# Patient Record
Sex: Female | Born: 1995 | Race: Black or African American | Hispanic: No | Marital: Single | Smoking: Never smoker
Health system: Southern US, Community
[De-identification: ages and names within clinical notes are randomized; demographics above are authoritative.]

## PROBLEM LIST (undated history)

## (undated) DIAGNOSIS — N809 Endometriosis, unspecified: Secondary | ICD-10-CM

## (undated) DIAGNOSIS — F419 Anxiety disorder, unspecified: Secondary | ICD-10-CM

---

## 2018-02-07 ENCOUNTER — Emergency Department (HOSPITAL_COMMUNITY)
Admission: EM | Admit: 2018-02-07 | Discharge: 2018-02-08 | Disposition: A | Discharge status: Home / Self Care | Payer: Self-pay | Attending: Emergency Medicine | Admitting: Emergency Medicine

## 2018-02-07 ENCOUNTER — Encounter (HOSPITAL_COMMUNITY): Payer: Self-pay

## 2018-02-07 DIAGNOSIS — F329 Major depressive disorder, single episode, unspecified: Secondary | ICD-10-CM | POA: Insufficient documentation

## 2018-02-07 DIAGNOSIS — R45851 Suicidal ideations: Secondary | ICD-10-CM

## 2018-02-07 DIAGNOSIS — F32A Depression, unspecified: Secondary | ICD-10-CM

## 2018-02-07 LAB — CBC
HCT: 35.7 % — ABNORMAL LOW (ref 36.0–46.0)
Hemoglobin: 11.8 g/dL — ABNORMAL LOW (ref 12.0–15.0)
MCH: 27 pg (ref 26.0–34.0)
MCHC: 33.1 g/dL (ref 30.0–36.0)
MCV: 81.7 fL (ref 78.0–100.0)
Platelets: 360 10*3/uL (ref 150–400)
RBC: 4.37 MIL/uL (ref 3.87–5.11)
RDW: 12.7 % (ref 11.5–15.5)
WBC: 6.2 10*3/uL (ref 4.0–10.5)

## 2018-02-07 LAB — COMPREHENSIVE METABOLIC PANEL
ALT: 9 U/L — ABNORMAL LOW (ref 14–54)
AST: 15 U/L (ref 15–41)
Albumin: 4.5 g/dL (ref 3.5–5.0)
Alkaline Phosphatase: 55 U/L (ref 38–126)
Anion gap: 13 (ref 5–15)
BUN: 11 mg/dL (ref 6–20)
CO2: 22 mmol/L (ref 22–32)
Calcium: 9.8 mg/dL (ref 8.9–10.3)
Chloride: 106 mmol/L (ref 101–111)
Creatinine, Ser: 0.63 mg/dL (ref 0.44–1.00)
GFR calc Af Amer: >60 mL/min (ref >60)
GFR calc non Af Amer: >60 mL/min (ref >60)
Glucose, Bld: 94 mg/dL (ref 65–99)
Potassium: 3.6 mmol/L (ref 3.5–5.1)
Sodium: 141 mmol/L (ref 135–145)
Total Bilirubin: 0.4 mg/dL (ref 0.3–1.2)
Total Protein: 8.2 g/dL — ABNORMAL HIGH (ref 6.5–8.1)

## 2018-02-07 LAB — RAPID URINE DRUG SCREEN, HOSP PERFORMED
Amphetamines: NOT DETECTED
Barbiturates: NOT DETECTED
Benzodiazepines: NOT DETECTED
Cocaine: NOT DETECTED
Opiates: NOT DETECTED
Tetrahydrocannabinol: NOT DETECTED

## 2018-02-07 LAB — I-STAT BETA HCG BLOOD, ED (MC, WL, AP ONLY): I-stat hCG, quantitative: <5 m[IU]/mL (ref <5)

## 2018-02-07 LAB — ETHANOL: Alcohol, Ethyl (B): <10 mg/dL (ref <10)

## 2018-02-07 MED ORDER — ONDANSETRON HCL 4 MG PO TABS: 4.0000 mg | ORAL_TABLET | Freq: Three times a day (TID) | ORAL | Status: DC | PRN

## 2018-02-07 MED ORDER — ACETAMINOPHEN 325 MG PO TABS: 650.0000 mg | ORAL_TABLET | ORAL | Status: DC | PRN

## 2018-02-07 MED ORDER — ALUM & MAG HYDROXIDE-SIMETH 200-200-20 MG/5ML PO SUSP: 30.0000 mL | Freq: Four times a day (QID) | ORAL | Status: DC | PRN

## 2018-02-07 NOTE — ED Provider Notes (Signed)
[facility]-EMERGENCY DEPT Provider Note   CSN: [id number] Arrival date & time: 02/07/18  2016     History   Chief Complaint Chief Complaint  Patient presents with  . Suicidal    HPI Truitt Merleveri Quizhpi is a 22 y.o. female.  The history is provided by the patient and medical records. No language interpreter was used.    Truitt Merleveri Junco is a 22 y.o. female  with no known PMH who presents to the Emergency Department complaining of suicidal thoughts. Patient states that she has had a lot of family issues recently.  She has just gotten close to her dad again, however her dad does not really understand depression or her mood changes.  She tried to vent to him, but he made her feel bad about it like it was her fault.  She also reports that her aunt has distanced herself from her as well.  Overall, she just feels as if her mood has been low.  She was at an apartment complex today and stood on the ledge, contemplating whether or not she should jump.  She was having suicidal thoughts.  She assumes a bystander or friend saw her as [employer] was called.  She states they told her she should come to the emergency department and she voluntarily agreed.  She believes that she should be here and is interested in seeking help.   History reviewed. No pertinent past medical history.  There are no active problems to display for this patient.   History reviewed. No pertinent surgical history.  OB History    No data available       Home Medications    Prior to Admission medications   Medication Sig Start Date End Date Taking? Authorizing Provider  naproxen sodium (ALEVE) 220 MG tablet Take 220 mg by mouth daily as needed.   Yes [provider]    Family History History reviewed. No pertinent family history.  Social History Social History   Tobacco Use  . Smoking status: Never Smoker  . Smokeless tobacco: Never Used  Substance Use Topics  . Alcohol use: No    Frequency: Never    . Drug use: No     Allergies   Cashew nut oil and Watermelon [citrullus vulgaris]   Review of Systems Review of Systems  Psychiatric/Behavioral: Positive for suicidal ideas.  All other systems reviewed and are negative.    Physical Exam Updated Vital Signs BP 124/82 (BP Location: Left Arm)   Pulse 80   Temp 98.1 F (36.7 C) (Oral)   Resp 18   LMP 01/27/2018   SpO2 100%   Physical Exam  Constitutional: She is oriented to person, place, and time. She appears well-developed and well-nourished. No distress.  HENT:  Head: Normocephalic and atraumatic.  Cardiovascular: Normal rate, regular rhythm and normal heart sounds.  No murmur heard. Pulmonary/Chest: Effort normal and breath sounds normal. No respiratory distress.  Abdominal: Soft. She exhibits no distension. There is no tenderness.  Musculoskeletal: Normal range of motion.  Neurological: She is alert and oriented to person, place, and time.  Skin: Skin is warm and dry.  Nursing note and vitals reviewed.   ED Treatments / Results  Labs (all labs ordered are listed, but only abnormal results are displayed) Labs Reviewed  COMPREHENSIVE METABOLIC PANEL - Abnormal; Notable for the following components:      Result Value   Total Protein 8.2 (*)    ALT 9 (*)    All other components  within normal limits  CBC - Abnormal; Notable for the following components:   Hemoglobin 11.8 (*)    HCT 35.7 (*)    All other components within normal limits  ETHANOL  RAPID URINE DRUG SCREEN, HOSP PERFORMED  I-STAT BETA HCG BLOOD, ED ([facility], [facility], [facility] ONLY)    EKG  EKG Interpretation None       Radiology No results found.  Procedures Procedures (including critical care time)  Medications Ordered in ED Medications - No data to display   Initial Impression / Assessment and Plan / ED Course  I have reviewed the triage vital signs and the nursing notes.  Pertinent labs & imaging results that were available during my care of  the patient were reviewed by me and considered in my medical decision making (see chart for details).    Otila Starn is a 22 y.o. female who presents to ED for suicidal thoughts. Afebrile, hemodynamically stable. Labs reviewed. Mild anemia - PCP follow up encouraged - otherwise reassuring. Medically cleared with dispo per TTS recommendations.   Final Clinical Impressions(s) / ED Diagnoses   Final diagnoses:  Suicidal thoughts    ED Discharge Orders    None       Fabien Travelstead, Chase Picket, PA-C 02/07/18 2314    Shaune Pollack, MD 02/08/18 [phone]

## 2018-02-07 NOTE — ED Triage Notes (Signed)
Pt is voluntary brought in by [employer], girlfriend is at bedside [employer] says that girlfriend called and said that the patient was going to kill herself, when [employer] arrived the patient was found sitting on the couch in the apartment and voluntarily wanted to come to the ED Pt states that she's having trouble at home but wouldn't elaborate too much

## 2018-02-08 ENCOUNTER — Other Ambulatory Visit: Payer: Self-pay

## 2018-02-08 ENCOUNTER — Encounter (HOSPITAL_COMMUNITY): Payer: Self-pay | Admitting: Emergency Medicine

## 2018-02-08 DIAGNOSIS — F32A Depression, unspecified: Secondary | ICD-10-CM

## 2018-02-08 DIAGNOSIS — F329 Major depressive disorder, single episode, unspecified: Secondary | ICD-10-CM

## 2018-02-08 DIAGNOSIS — R45851 Suicidal ideations: Secondary | ICD-10-CM

## 2018-02-08 NOTE — BHH Counselor (Signed)
Attempted to assess the pt.  TTS called the pt's name and she opened her eyes and put the sheet over her head.  Her name was called loudly several more times.  The pt did not respond.  Will attempt to assess the pt at another time.

## 2018-02-08 NOTE — BH Assessment (Signed)
[facility]  Per Laveda AbbeLaurie Britton Parks, FNP, this pt does not require psychiatric hospitalization at this time.  [facility].  This has been included in pt's discharge instructions.  Pt's nurse, Carlisle BeersLuann, has been notified.  Doylene Canninghomas Larwence Tu, MA Triage Specialist [phone]

## 2018-02-08 NOTE — Discharge Instructions (Signed)
For your behavioral health needs you are advised to follow up with [facility].  New patients are seen at their walk-in clinic.  Walk-in hours are Monday - Friday from 8:00 am - 12:00 pm, and from 1:00 pm - 3:00 pm.  Walk-in patients are seen on a first come, first served basis, so try to arrive as early as possible for the best chance of being seen the same day.  There is an initial fee of $22.50: ° °     [facility] °     [address] °     [place], [place] [address] °     [phone] °

## 2018-02-08 NOTE — BH Assessment (Signed)
Assessment Note  Anne Weber is a 22 y.o. female who presented to [facility] on 02/07/18 (transported with police) after she made a suicidal threat to girlfriend.  Pt has not been assessed by TTS previously.  Pt lives in [place] with her girlfriend and a roommate.  She works and will begin a job with [employer] soon.  Pt reported that she has been upset lately because of the pending anniversary of her mother's death.  Pt stated that yesterday she argued with her girlfriend, felt unheard, and in frustration stated, ''I'm going to jump off a bridge."  Pt denied that it was an actual suicide threat.  She denied any past suicide attempts.  Pt endorsed despondency, irritability, and some insomnia (about 6 hours per night).  Pt denied any past psychiatric or therapy care, but she acknowledged that she needed therapy to help her sort through emotions and develop appropriate coping skills.  Pt denied homicidal ideation, auditory/visual hallucination, self-injurious behavior, and substance-use behavior.  During assessment, Pt presented as alert and oriented.  She had good eye contact and was cooperative.  Pt was dressed in scrubs and appeared appropriately groomed.  Pt endorsed some despondency, irritability, and insomnia.  She denied suicidal ideation, homicidal ideation, hallucination, substance use concerns, and self-injurious behavior.  Pt denied any past psychiatric care.  Pt's speech was normal in rate, rhythm, and volume.  Pt's thought processes were within normal range, and thought content was logical and goal-oriented.  There was no evidence of delusion.  Pt's memory and concentration were intact.  Pt's impulse control was fair.  Judgment and insight were fair.  Consulted with Irving Burton, NP who recommended discharge with referral to appropriate outpatient provider.  Diagnosis: F32.2 Major Depressive Disorder, Single EP, Moderate  Past Medical History: History reviewed. No pertinent past medical history.  History  reviewed. No pertinent surgical history.  Family History: History reviewed. No pertinent family history.  Social History:  reports that  has never smoked. she has never used smokeless tobacco. She reports that she does not drink alcohol or use drugs.  Additional Social History:  Alcohol / Drug Use Pain Medications: See MAR Prescriptions: See MAR Over the Counter: See MAR History of alcohol / drug use?: No history of alcohol / drug abuse  CIWA: CIWA-Ar BP: 111/72 Pulse Rate: 82 COWS:    Allergies:  Allergies  Allergen Reactions  . Cashew Nut Oil Other (See Comments)    Tongue swelling  . Watermelon [Citrullus Vulgaris] Hives    Home Medications:  (Not in a hospital admission)  OB/GYN Status:  Patient's last menstrual period was 01/27/2018.  General Assessment Data Assessment unable to be completed: Yes Reason for not completing assessment: pt would not respond [facility] Assessment: [facility] TTS Assessment: In system Is this a Tele or Face-to-Face Assessment?: Face-to-Face Is this an Initial Assessment or a Re-assessment for this encounter?: Initial Assessment Marital status: Long term relationship Is patient pregnant?: No Pregnancy Status: No Living Arrangements: Spouse/significant other, Other (Comment)(Lives with girlfriend, roommate) Can pt return to current living arrangement?: Yes Admission Status: Voluntary Is patient capable of signing voluntary admission?: Yes Referral Source: Self/Family/Friend Insurance type: Self pay     Crisis Care Plan Living Arrangements: Spouse/significant other, Other (Comment)(Lives with girlfriend, roommate) Name of Psychiatrist: None Name of Therapist: None  Education Status Is patient currently in school?: No  Risk to self with the past 6 months Suicidal Ideation: No-Not Currently/Within Last 6 Months Has patient been a risk to self within  the past 6 months prior to admission? : No Suicidal Intent: No-Not Currently/Within  Last 6 Months Has patient had any suicidal intent within the past 6 months prior to admission? : No Is patient at risk for suicide?: No Suicidal Plan?: No-Not Currently/Within Last 6 Months Has patient had any suicidal plan within the past 6 months prior to admission? : No Access to Means: No What has been your use of drugs/alcohol within the last 12 months?: Denied Previous Attempts/Gestures: No Intentional Self Injurious Behavior: None Family Suicide History: No Recent stressful life event(s): Conflict (Comment)(Conflict with roommate) Persecutory voices/beliefs?: No Depression: Yes Depression Symptoms: Despondent, Feeling angry/irritable Substance abuse history and/or treatment for substance abuse?: No Suicide prevention information given to non-admitted patients: Not applicable  Risk to Others within the past 6 months Homicidal Ideation: No Does patient have any lifetime risk of violence toward others beyond the six months prior to admission? : No Thoughts of Harm to Others: No Current Homicidal Intent: No Current Homicidal Plan: No Access to Homicidal Means: No History of harm to others?: No Assessment of Violence: None Noted Does patient have access to weapons?: No Criminal Charges Pending?: No Does patient have a court date: No Is patient on probation?: No  Psychosis Hallucinations: None noted Delusions: None noted  Mental Status Report Appearance/Hygiene: Unremarkable, In scrubs Eye Contact: Good Motor Activity: Freedom of movement Speech: Logical/coherent Level of Consciousness: Alert Mood: Euthymic, Ambivalent Affect: Appropriate to circumstance Anxiety Level: None Thought Processes: Coherent, Relevant Judgement: Partial Orientation: Person, Place, Time, Situation Obsessive Compulsive Thoughts/Behaviors: None  Cognitive Functioning Concentration: Normal Memory: Recent Intact, Remote Intact IQ: Average Insight: Fair Impulse Control: Fair Appetite:  Good Sleep: Decreased Total Hours of Sleep: 6 Vegetative Symptoms: None  ADLScreening [facility] Assessment Services) Patient's cognitive ability adequate to safely complete daily activities?: Yes Patient able to express need for assistance with ADLs?: Yes Independently performs ADLs?: Yes (appropriate for developmental age)  Prior Inpatient Therapy Prior Inpatient Therapy: No  Prior Outpatient Therapy Prior Outpatient Therapy: No Does patient have an ACCT team?: No Does patient have Intensive In-House Services?  : No Does patient have [facility] services? : No Does patient have P4CC services?: No  ADL Screening (condition at time of admission) Patient's cognitive ability adequate to safely complete daily activities?: Yes Is the patient deaf or have difficulty hearing?: No Does the patient have difficulty seeing, even when wearing glasses/contacts?: No Does the patient have difficulty concentrating, remembering, or making decisions?: No Patient able to express need for assistance with ADLs?: Yes Does the patient have difficulty dressing or bathing?: No Independently performs ADLs?: Yes (appropriate for developmental age) Does the patient have difficulty walking or climbing stairs?: No Weakness of Legs: None Weakness of Arms/Hands: None  Home Assistive Devices/Equipment Home Assistive Devices/Equipment: None  Therapy Consults (therapy consults require a physician order) PT Evaluation Needed: No OT Evalulation Needed: No SLP Evaluation Needed: No Abuse/Neglect Assessment (Assessment to be complete while patient is alone) Abuse/Neglect Assessment Can Be Completed: Yes Physical Abuse: Denies Verbal Abuse: Denies(Some trauma around death of mother) Sexual Abuse: Denies Exploitation of patient/patient's resources: Denies Self-Neglect: Denies Values / Beliefs Cultural Requests During Hospitalization: None Spiritual Requests During Hospitalization: None Consults Spiritual Care  Consult Needed: No Social Work Consult Needed: No Merchant navy officer (For Healthcare) Does Patient Have a Medical Advance Directive?: No Would patient like information on creating a medical advance directive?: No - Patient declined    Additional Information 1:1 In Past 12 Months?: No CIRT Risk: No  Elopement Risk: No Does patient have medical clearance?: Yes     Disposition:  Disposition Initial Assessment Completed for this Encounter: Yes Disposition of Patient: Outpatient treatment Type of outpatient treatment: Adult(Per L. Arville CareParks, NP, Pt meets inpt criteria)  On Site Evaluation by:   Reviewed with Physician:    Dorris FetchEugene T Gianelle Mccaul 02/08/2018 9:07 AM

## 2018-02-08 NOTE — ED Notes (Signed)
Patient reports SI with a plan of jumping off bridge. Patient denies HI and AVH. Plan of care discussed. Sandwich and soda given. Encouragement and support provided and safety maintain. Q 15 min safety checks in place and video monitoring.

## 2018-02-08 NOTE — BHH Suicide Risk Assessment (Signed)
Suicide Risk Assessment  Discharge Assessment   [facility]   Principal Problem: Depression Discharge Diagnoses:  Patient Active Problem List   Diagnosis Date Noted  . Depression [F32.9] 02/08/2018   Pt was seen and chart reviewed with treatment team and Dr Sharma CovertNorman. Pt stated she became upset and angry with her roommate and impulsively said she was going to kill herself.  Pt denies suicidal/homicidal ideation, denies auditory/visual hallucinations and does not appear to be responding to internal stimuli. Pt will be given outpatient resources for therapy. Pt is psychiatrically clear for discharge.   Total Time spent with patient: 30 minutes  Musculoskeletal: Strength & Muscle Tone: within normal limits Gait & Station: normal Patient leans: N/A  Psychiatric Specialty Exam:   Blood pressure 111/72, pulse 82, temperature 98.2 F (36.8 C), temperature source Oral, resp. rate 16, last menstrual period 01/27/2018, SpO2 98 %.There is no height or weight on file to calculate BMI.  General Appearance: Casual  Eye Contact::  Good  Speech:  Clear and Coherent409  Volume:  Normal  Mood:  Euthymic  Affect:  Congruent  Thought Process:  Coherent, Goal Directed and Linear  Orientation:  Full (Time, Place, and Person)  Thought Content:  Logical  Suicidal Thoughts:  No  Homicidal Thoughts:  No  Memory:  Immediate;   Good Recent;   Good Remote;   Fair  Judgement:  Fair  Insight:  Fair  Psychomotor Activity:  Normal  Concentration:  Good  Recall:  Good  Fund of Knowledge:Good  Language: Good  Akathisia:  No  Handed:  Right  AIMS (if indicated):     Assets:  ArchitectCommunication Skills Financial Resources/Insurance Housing Physical Health Social Support Vocational/Educational  Sleep:     Cognition: WNL  ADL's:  Intact   Mental Status Per Nursing Assessment::   On Admission:   Upset and angry with roommate  Demographic Factors:  Adolescent or young  adult  Loss Factors: NA  Historical Factors: Impulsivity  Risk Reduction Factors:   Sense of responsibility to family and Living with another person, especially a relative  Continued Clinical Symptoms:  Depression:   Impulsivity  Cognitive Features That Contribute To Risk:  Closed-mindedness    Suicide Risk:  Minimal: No identifiable suicidal ideation.  Patients presenting with no risk factors but with morbid ruminations; may be classified as minimal risk based on the severity of the depressive symptoms    Plan Of Care/Follow-up recommendations:  Activity:  as tolerated Diet:  Heart Healthy  Laveda AbbeLaurie Britton Michaline Kindig, NP 02/08/2018, 11:41 AM

## 2018-03-30 ENCOUNTER — Emergency Department (HOSPITAL_COMMUNITY): Admission: EM | Admit: 2018-03-30 | Discharge: 2018-03-30 | Discharge status: Against Medical Advice | Payer: Self-pay

## 2018-03-30 NOTE — ED Notes (Signed)
Pt called from lobby x3 with no response

## 2018-03-30 NOTE — ED Notes (Signed)
Pt called from the lobby with no response 

## 2018-03-30 NOTE — ED Notes (Signed)
Pt called from the lobby with no response x2 

## 2018-07-09 ENCOUNTER — Emergency Department (HOSPITAL_COMMUNITY)
Admission: EM | Admit: 2018-07-09 | Discharge: 2018-07-10 | Disposition: A | Discharge status: Home / Self Care | Payer: Self-pay | Attending: Emergency Medicine | Admitting: Emergency Medicine

## 2018-07-09 ENCOUNTER — Emergency Department (HOSPITAL_COMMUNITY): Payer: Self-pay

## 2018-07-09 ENCOUNTER — Other Ambulatory Visit: Payer: Self-pay

## 2018-07-09 ENCOUNTER — Encounter (HOSPITAL_COMMUNITY): Payer: Self-pay | Admitting: Emergency Medicine

## 2018-07-09 DIAGNOSIS — R9431 Abnormal electrocardiogram [ECG] [EKG]: Secondary | ICD-10-CM | POA: Insufficient documentation

## 2018-07-09 DIAGNOSIS — R0789 Other chest pain: Secondary | ICD-10-CM | POA: Insufficient documentation

## 2018-07-09 HISTORY — DX: Anxiety disorder, unspecified: F41.9

## 2018-07-09 LAB — BASIC METABOLIC PANEL
Anion gap: 9 (ref 5–15)
BUN: 10 mg/dL (ref 6–20)
CALCIUM: 9.6 mg/dL (ref 8.9–10.3)
CO2: 25 mmol/L (ref 22–32)
CREATININE: 0.69 mg/dL (ref 0.44–1.00)
Chloride: 109 mmol/L (ref 98–111)
Glucose, Bld: 95 mg/dL (ref 70–99)
Potassium: 3.6 mmol/L (ref 3.5–5.1)
SODIUM: 143 mmol/L (ref 135–145)

## 2018-07-09 LAB — CBC
HCT: 32.5 % — ABNORMAL LOW (ref 36.0–46.0)
Hemoglobin: 10.7 g/dL — ABNORMAL LOW (ref 12.0–15.0)
MCH: 27.2 pg (ref 26.0–34.0)
MCHC: 32.9 g/dL (ref 30.0–36.0)
MCV: 82.7 fL (ref 78.0–100.0)
Platelets: 398 10*3/uL (ref 150–400)
RBC: 3.93 MIL/uL (ref 3.87–5.11)
RDW: 12.6 % (ref 11.5–15.5)
WBC: 4.5 10*3/uL (ref 4.0–10.5)

## 2018-07-09 LAB — I-STAT TROPONIN, ED: TROPONIN I, POC: 0 ng/mL (ref 0.00–0.08)

## 2018-07-09 LAB — I-STAT BETA HCG BLOOD, ED (MC, WL, AP ONLY)

## 2018-07-09 MED ORDER — NAPROXEN 500 MG PO TABS
500.0000 mg | ORAL_TABLET | Freq: Once | ORAL | Status: AC
  Administered 2018-07-09: 500 mg via ORAL
  Filled 2018-07-09: qty 1

## 2018-07-09 NOTE — ED Triage Notes (Signed)
Pt reports having chest pain for the last 2 months that seemed to get worse today around 6pm that started suddenly. Pt reports having previous issues with chest pain and was dx with anxiety but not prescribed any medications.

## 2018-07-10 MED ORDER — CYCLOBENZAPRINE HCL 10 MG PO TABS
5.0000 mg | ORAL_TABLET | Freq: Once | ORAL | Status: AC
  Administered 2018-07-10: 5 mg via ORAL
  Filled 2018-07-10: qty 1

## 2018-07-10 MED ORDER — NAPROXEN 500 MG PO TABS: ORAL_TABLET | ORAL | 0 refills | Status: DC

## 2018-07-10 NOTE — ED Provider Notes (Signed)
[facility]-EMERGENCY DEPT Provider Note   CSN: [id number] Arrival date & time: 07/09/18  2156  Time seen 23:15 PM   History   Chief Complaint Chief Complaint  Patient presents with  . Chest Pain    HPI Anne Weber is a 22 y.o. female.  HPI patient states she is been having pain in her central or bilateral upper chest off and on for the past few months.  It can happen 1-2 times a week.  It usually last about an hour.  She describes the pain as pressure and tightness.  She states nothing makes it worse however a friend in her room states anxiety makes it worse.  She states sitting up makes it feel better.  Sometimes she feels short of breath with it.  She denies diaphoresis.  She states tonight she has a headache and stated it is bitemporal and describes as a pressure.  She sometimes has nausea with it but no vomiting.  She denies any swelling of her extremities.  She states today the pain started about 5 PM and it is still present.  Is is in the center of her chest now and she describes it as pressure and tightness.  She was evaluated 2 to 3 weeks ago to urgent care and was told it was from anxiety and they referred her to a therapist.  Patient is concerned because her mother had cardiomyopathy and died at age 108.  She states it started in 2001 after the birth of her sister.  She states there is also other family history of heart disease in her mother's family.  She is never been evaluated by a cardiologist.  PCP Patient, No Pcp Per   Past Medical History:  Diagnosis Date  . Anxiety     Patient Active Problem List   Diagnosis Date Noted  . Depression 02/08/2018  . Suicidal ideation 02/08/2018    History reviewed. No pertinent surgical history.   OB History   None      Home Medications    Prior to Admission medications   Medication Sig Start Date End Date Taking? Authorizing Provider  naproxen (NAPROSYN) 500 MG tablet Take 1 po BID with food prn pain  07/10/18   Devoria Albe, MD  naproxen sodium (ALEVE) 220 MG tablet Take 220 mg by mouth daily as needed.    [provider]    Family History History reviewed. No pertinent family history.  Social History Social History   Tobacco Use  . Smoking status: Never Smoker  . Smokeless tobacco: Never Used  Substance Use Topics  . Alcohol use: No    Frequency: Never    Comment: Pt denied  . Drug use: No    Comment: Pt denied  employed   Allergies   Cashew nut oil and Watermelon [citrullus vulgaris]   Review of Systems Review of Systems  All other systems reviewed and are negative.    Physical Exam Updated Vital Signs BP 120/82   Pulse 81   Temp 98.2 F (36.8 C) (Oral)   Resp (!) 25   Ht 5\' 5"  (1.651 m)   Wt 89.8 kg (198 lb)   LMP 07/03/2018   SpO2 100%   BMI 32.95 kg/m   Vital signs normal    Physical Exam  Constitutional: She is oriented to person, place, and time. She appears well-developed and well-nourished.  Non-toxic appearance. She does not appear ill. No distress.  HENT:  Head: Normocephalic and atraumatic.  Right Ear:  External ear normal.  Left Ear: External ear normal.  Nose: Nose normal. No mucosal edema or rhinorrhea.  Mouth/Throat: Oropharynx is clear and moist and mucous membranes are normal. No dental abscesses or uvula swelling.  Eyes: Pupils are equal, round, and reactive to light. Conjunctivae and EOM are normal.  Neck: Normal range of motion and full passive range of motion without pain. Neck supple.  Cardiovascular: Normal rate, regular rhythm and normal heart sounds. Exam reveals no gallop and no friction rub.  No murmur heard. Pulmonary/Chest: Effort normal and breath sounds normal. No respiratory distress. She has no wheezes. She has no rhonchi. She has no rales. She exhibits no tenderness and no crepitus.  She is very tender to palpation of her upper sternal region which reproduces her complaints of chest pain tonight    Abdominal:  Soft. Normal appearance and bowel sounds are normal. She exhibits no distension. There is no tenderness. There is no rebound and no guarding.  Musculoskeletal: Normal range of motion. She exhibits no edema or tenderness.  Moves all extremities well.   Neurological: She is alert and oriented to person, place, and time. She has normal strength. No cranial nerve deficit.  Skin: Skin is warm, dry and intact. No rash noted. No erythema. No pallor.  Psychiatric: She has a normal mood and affect. Her speech is normal and behavior is normal. Her mood appears not anxious.  Nursing note and vitals reviewed.    ED Treatments / Results  Labs (all labs ordered are listed, but only abnormal results are displayed) Results for orders placed or performed during the hospital encounter of 07/09/18  Basic metabolic panel  Result Value Ref Range   Sodium 143 135 - 145 mmol/L   Potassium 3.6 3.5 - 5.1 mmol/L   Chloride 109 98 - 111 mmol/L   CO2 25 22 - 32 mmol/L   Glucose, Bld 95 70 - 99 mg/dL   BUN 10 6 - 20 mg/dL   Creatinine, Ser 5.280.69 0.44 - 1.00 mg/dL   Calcium 9.6 8.9 - 41.310.3 mg/dL   GFR calc non Af Amer >60 >60 mL/min   GFR calc Af Amer >60 >60 mL/min   Anion gap 9 5 - 15  CBC  Result Value Ref Range   WBC 4.5 4.0 - 10.5 K/uL   RBC 3.93 3.87 - 5.11 MIL/uL   Hemoglobin 10.7 (L) 12.0 - 15.0 g/dL   HCT 24.432.5 (L) 01.036.0 - 27.246.0 %   MCV 82.7 78.0 - 100.0 fL   MCH 27.2 26.0 - 34.0 pg   MCHC 32.9 30.0 - 36.0 g/dL   RDW 53.612.6 64.411.5 - 03.415.5 %   Platelets 398 150 - 400 K/uL  I-stat troponin, ED  Result Value Ref Range   Troponin i, poc 0.00 0.00 - 0.08 ng/mL   Comment 3          I-Stat beta hCG blood, ED  Result Value Ref Range   I-stat hCG, quantitative <5.0 <5 mIU/mL   Comment 3           Laboratory interpretation all normal except mild anemia    EKG EKG Interpretation  Date/Time:  Sunday July 09 2018 22:22:49 EDT Ventricular Rate:  89 PR Interval:    QRS Duration: 78 QT Interval:  440 QTC  Calculation: 536 R Axis:   108 Text Interpretation:  Sinus rhythm Borderline right axis deviation Low voltage, precordial leads Borderline T abnormalities, diffuse leads Prolonged QT interval Baseline wander in lead(s) I III  aVL Abnormal ekg Confirmed by Gerhard Munch [phone]) on 07/09/2018 10:31:14 PM   Radiology Dg Chest 2 View  Result Date: 07/09/2018 CLINICAL DATA:  Chest pain worsening over the past 2 months. EXAM: CHEST - 2 VIEW COMPARISON:  None. FINDINGS: The heart size and mediastinal contours are within normal limits. Both lungs are clear. The visualized skeletal structures are unremarkable. IMPRESSION: No active cardiopulmonary disease. Electronically Signed   By: Tollie Eth M.D.   On: 07/09/2018 23:02    Procedures Procedures (including critical care time)  Medications Ordered in ED Medications  cyclobenzaprine (FLEXERIL) tablet 5 mg (has no administration in time range)  naproxen (NAPROSYN) tablet 500 mg (500 mg Oral Given 07/09/18 2337)     Initial Impression / Assessment and Plan / ED Course  I have reviewed the triage vital signs and the nursing notes.  Pertinent labs & imaging results that were available during my care of the patient were reviewed by me and considered in my medical decision making (see chart for details).    Patient is noted to have diffuse flipped T waves on her EKG without LVH, so it does not appear to be a strain pattern.  We discussed her EKG and need to have cardiology evaluation.  She will be referred to the cardiologist on-call.  She was given naproxen for pain, her pain tonight appears to be more costochondral or chest wall type pain.  However she does have a family history of cardiomyopathy and should be evaluated by cardiologist and get a echocardiogram.  Final Clinical Impressions(s) / ED Diagnoses   Final diagnoses:  Chest wall pain  Abnormal EKG    ED Discharge Orders        Ordered    naproxen (NAPROSYN) 500 MG tablet     07/10/18  0023      Plan discharge  Devoria Albe, MD, Concha Pyo, MD 07/10/18 [phone]

## 2018-07-10 NOTE — Discharge Instructions (Signed)
Take the naproxen for your chest pain.  Use ice and heat for comfort. Please call the cardiology office to schedule a visit to evaluate your chest pain and the "flipped T waves" that are on your EKG. Return to the ED if you get worse such as fever, struggling to breathe or severe chest pain.

## 2018-10-02 ENCOUNTER — Other Ambulatory Visit: Payer: Self-pay

## 2018-10-02 ENCOUNTER — Encounter (HOSPITAL_COMMUNITY): Payer: Self-pay | Admitting: Emergency Medicine

## 2018-10-02 ENCOUNTER — Emergency Department (HOSPITAL_COMMUNITY): Payer: Medicaid Other

## 2018-10-02 ENCOUNTER — Emergency Department (HOSPITAL_COMMUNITY)
Admission: EM | Admit: 2018-10-02 | Discharge: 2018-10-02 | Disposition: A | Discharge status: Home / Self Care | Payer: Medicaid Other | Attending: Emergency Medicine | Admitting: Emergency Medicine

## 2018-10-02 DIAGNOSIS — R0602 Shortness of breath: Secondary | ICD-10-CM | POA: Insufficient documentation

## 2018-10-02 DIAGNOSIS — R0789 Other chest pain: Secondary | ICD-10-CM

## 2018-10-02 DIAGNOSIS — R1110 Vomiting, unspecified: Secondary | ICD-10-CM | POA: Insufficient documentation

## 2018-10-02 LAB — CBC
HEMATOCRIT: 32.6 % — AB (ref 36.0–46.0)
HEMOGLOBIN: 10.6 g/dL — AB (ref 12.0–15.0)
MCH: 27.4 pg (ref 26.0–34.0)
MCHC: 32.5 g/dL (ref 30.0–36.0)
MCV: 84.2 fL (ref 78.0–100.0)
Platelets: 246 10*3/uL (ref 150–400)
RBC: 3.87 MIL/uL (ref 3.87–5.11)
RDW: 13.3 % (ref 11.5–15.5)
WBC: 6.3 10*3/uL (ref 4.0–10.5)

## 2018-10-02 LAB — BASIC METABOLIC PANEL
ANION GAP: 10 (ref 5–15)
BUN: 17 mg/dL (ref 6–20)
CHLORIDE: 104 mmol/L (ref 98–111)
CO2: 25 mmol/L (ref 22–32)
Calcium: 9.2 mg/dL (ref 8.9–10.3)
Creatinine, Ser: 1 mg/dL (ref 0.44–1.00)
GFR calc non Af Amer: >60 mL/min (ref >60)
Glucose, Bld: 92 mg/dL (ref 70–99)
Potassium: 4.4 mmol/L (ref 3.5–5.1)
Sodium: 139 mmol/L (ref 135–145)

## 2018-10-02 LAB — D-DIMER, QUANTITATIVE: D-Dimer, Quant: 0.68 ug/mL-FEU — ABNORMAL HIGH (ref 0.00–0.50)

## 2018-10-02 LAB — TROPONIN I: Troponin I: <0.03 ng/mL (ref <0.03)

## 2018-10-02 LAB — POCT PREGNANCY, URINE: Preg Test, Ur: NEGATIVE

## 2018-10-02 MED ORDER — IOPAMIDOL (ISOVUE-370) INJECTION 76%
INTRAVENOUS | Status: AC
  Filled 2018-10-02: qty 100

## 2018-10-02 MED ORDER — ACETAMINOPHEN 325 MG PO TABS
650.0000 mg | ORAL_TABLET | Freq: Once | ORAL | Status: AC
  Administered 2018-10-02: 650 mg via ORAL
  Filled 2018-10-02: qty 2

## 2018-10-02 MED ORDER — IOPAMIDOL (ISOVUE-300) INJECTION 61%: 100.0000 mL | Freq: Once | INTRAVENOUS | Status: DC | PRN

## 2018-10-02 MED ORDER — SODIUM CHLORIDE 0.9 % IJ SOLN
INTRAMUSCULAR | Status: AC
  Filled 2018-10-02: qty 50

## 2018-10-02 MED ORDER — ONDANSETRON HCL 4 MG/2ML IJ SOLN
4.0000 mg | Freq: Once | INTRAMUSCULAR | Status: AC
  Administered 2018-10-02: 4 mg via INTRAVENOUS
  Filled 2018-10-02: qty 2

## 2018-10-02 MED ORDER — IOPAMIDOL (ISOVUE-370) INJECTION 76%
100.0000 mL | Freq: Once | INTRAVENOUS | Status: AC | PRN
  Administered 2018-10-02: 80 mL via INTRAVENOUS

## 2018-10-02 NOTE — ED Notes (Signed)
Patient transported to CT 

## 2018-10-02 NOTE — ED Triage Notes (Signed)
Constant central chest pain "like it's closing up" onset 100 today; associated episode of emesis and overheating post starting.

## 2018-10-02 NOTE — ED Provider Notes (Signed)
[facility]-EMERGENCY DEPT Provider Note   CSN: [id number] Arrival date & time: 10/02/18  1457     History   Chief Complaint Chief Complaint  Patient presents with  . Chest Pain    HPI Anne Weber is a 22 y.o. female.  The history is provided by the patient. No language interpreter was used.  Chest Pain     Anne Weber is a 22 y.o. female who presents to the Emergency Department complaining of chest pain. She developed central chest pain that began around noon today. Pain is described as a constant and squeezing sensation. She has associated nausea with emesis times two. The pain has radiated down towards her lower abdomen. She has chronic shortness of breath and this is unchanged from baseline. She denies any fevers, cough, diarrhea. She does have a history of chest pain and has been evaluated in the past, but today's pain is different than prior chest pain. She has a family history of heart disease. Her mother died from a cardiomyopathy at the age of 70. No lower extremity swelling or pain. She takes no prescription medications. She smokes marijuana. No alcohol use. Past Medical History:  Diagnosis Date  . Anxiety     Patient Active Problem List   Diagnosis Date Noted  . Depression 02/08/2018  . Suicidal ideation 02/08/2018    History reviewed. No pertinent surgical history.   OB History   None      Home Medications    Prior to Admission medications   Medication Sig Start Date End Date Taking? Authorizing Provider  acetaminophen (TYLENOL) 500 MG tablet Take 500 mg by mouth every 6 (six) hours as needed for mild pain.   Yes [provider]  naproxen sodium (ALEVE) 220 MG tablet Take 220 mg by mouth daily as needed (chest pain).    Yes [provider]    Family History No family history on file.  Social History Social History   Tobacco Use  . Smoking status: Never Smoker  . Smokeless tobacco: Never Used  Substance Use  Topics  . Alcohol use: No    Frequency: Never    Comment: Pt denied  . Drug use: No    Comment: Pt denied     Allergies   Cashew nut oil and Watermelon [citrullus vulgaris]   Review of Systems Review of Systems  Cardiovascular: Positive for chest pain.  All other systems reviewed and are negative.    Physical Exam Updated Vital Signs BP 117/76   Pulse 81   Temp 98.5 F (36.9 C) (Oral)   Resp 17   LMP 09/25/2018 (Within Days)   SpO2 95%   Physical Exam  Constitutional: She is oriented to person, place, and time. She appears well-developed and well-nourished.  HENT:  Head: Normocephalic and atraumatic.  Cardiovascular: Normal rate and regular rhythm.  No murmur heard. Pulmonary/Chest: Effort normal and breath sounds normal. No respiratory distress.  Abdominal: Soft. There is no tenderness. There is no rebound and no guarding.  Musculoskeletal: She exhibits no edema or tenderness.  Neurological: She is alert and oriented to person, place, and time.  Skin: Skin is warm and dry.  Psychiatric: She has a normal mood and affect. Her behavior is normal.  Nursing note and vitals reviewed.    ED Treatments / Results  Labs (all labs ordered are listed, but only abnormal results are displayed) Labs Reviewed  CBC - Abnormal; Notable for the following components:      Result  Value   Hemoglobin 10.6 (*)    HCT 32.6 (*)    All other components within normal limits  D-DIMER, QUANTITATIVE (NOT AT [facility]) - Abnormal; Notable for the following components:   D-Dimer, Quant 0.68 (*)    All other components within normal limits  BASIC METABOLIC PANEL  TROPONIN I  POC URINE PREG, ED  POCT PREGNANCY, URINE    EKG EKG Interpretation  Date/Time:  Monday October 02 2018 15:03:31 EDT Ventricular Rate:  91 PR Interval:    QRS Duration: 99 QT Interval:  328 QTC Calculation: 404 R Axis:   75 Text Interpretation:  Sinus rhythm Low voltage, precordial leads Confirmed by Tilden Fossa [phone]) on 10/02/2018 4:02:17 PM   Radiology Dg Chest 2 View  Result Date: 10/02/2018 CLINICAL DATA:  Patient states she is having chest pain that radiates down to her uterus! She has had issues with this in the past but normally tylenol helps but not this time. EXAM: CHEST - 2 VIEW COMPARISON:  07/09/2018 FINDINGS: Normal heart, mediastinum and hila. Lungs are clear.  No pleural effusion or pneumothorax. Skeletal structures are unremarkable. IMPRESSION: Normal chest radiographs. Electronically Signed   By: Amie Portland M.D.   On: 10/02/2018 15:39   Ct Angio Chest Pe W/cm &/or Wo Cm  Result Date: 10/02/2018 CLINICAL DATA:  Constant central chest pain. EXAM: CT ANGIOGRAPHY CHEST WITH CONTRAST TECHNIQUE: Multidetector CT imaging of the chest was performed using the standard protocol during bolus administration of intravenous contrast. Multiplanar CT image reconstructions and MIPs were obtained to evaluate the vascular anatomy. CONTRAST:  80mL ISOVUE-370 IOPAMIDOL (ISOVUE-370) INJECTION 76% COMPARISON:  Chest radiographs dated 10/02/2018. FINDINGS: Cardiovascular: Satisfactory opacification of the pulmonary arteries to the segmental level. No evidence of pulmonary embolism. Normal heart size. No pericardial effusion. Mediastinum/Nodes: No enlarged mediastinal, hilar, or axillary lymph nodes. Thyroid gland, trachea, and esophagus demonstrate no significant findings. Lungs/Pleura: Lungs are clear. No pleural effusion or pneumothorax. Upper Abdomen: Unremarkable. Musculoskeletal: Normal appearing bones. Review of the MIP images confirms the above findings. IMPRESSION: Normal examination.  No pulmonary emboli seen. Electronically Signed   By: Beckie Salts M.D.   On: 10/02/2018 21:17    Procedures Procedures (including critical care time)  Medications Ordered in ED Medications  iopamidol (ISOVUE-370) 76 % injection (has no administration in time range)  sodium chloride 0.9 % injection (has no  administration in time range)  iopamidol (ISOVUE-300) 61 % injection 100 mL (has no administration in time range)  ondansetron (ZOFRAN) injection 4 mg (4 mg Intravenous Given 10/02/18 1744)  acetaminophen (TYLENOL) tablet 650 mg (650 mg Oral Given 10/02/18 1743)  iopamidol (ISOVUE-370) 76 % injection 100 mL (80 mLs Intravenous Contrast Given 10/02/18 2051)     Initial Impression / Assessment and Plan / ED Course  I have reviewed the triage vital signs and the nursing notes.  Pertinent labs & imaging results that were available during my care of the patient were reviewed by me and considered in my medical decision making (see chart for details).    Patient here for evaluation of episode of chest pain associated with vomiting. EKG with nonspecific changes, unchanged compared to prior. She does have a family history of cardiomyopathy. She is low risk for PE, will send D dimer. Presentation is not consistent with ACS, dissection.  D dimer is a slightly elevated. CTA obtained. CTA is negative for dissection. Plan to discharge home with outpatient cardiology follow-up as well as return precautions. Final Clinical Impressions(s) /  ED Diagnoses   Final diagnoses:  Atypical chest pain    ED Discharge Orders    None       Tilden Fossa, MD 10/02/18 2255

## 2018-12-24 ENCOUNTER — Encounter (HOSPITAL_COMMUNITY): Payer: Self-pay | Admitting: Emergency Medicine

## 2018-12-24 ENCOUNTER — Emergency Department (HOSPITAL_COMMUNITY)
Admission: EM | Admit: 2018-12-24 | Discharge: 2018-12-25 | Disposition: A | Discharge status: Home / Self Care | Payer: Medicaid Other | Attending: Emergency Medicine | Admitting: Emergency Medicine

## 2018-12-24 ENCOUNTER — Other Ambulatory Visit: Payer: Self-pay

## 2018-12-24 DIAGNOSIS — R0602 Shortness of breath: Secondary | ICD-10-CM | POA: Insufficient documentation

## 2018-12-24 DIAGNOSIS — R05 Cough: Secondary | ICD-10-CM | POA: Insufficient documentation

## 2018-12-24 DIAGNOSIS — R1084 Generalized abdominal pain: Secondary | ICD-10-CM | POA: Insufficient documentation

## 2018-12-24 DIAGNOSIS — R109 Unspecified abdominal pain: Secondary | ICD-10-CM

## 2018-12-24 DIAGNOSIS — R42 Dizziness and giddiness: Secondary | ICD-10-CM | POA: Insufficient documentation

## 2018-12-24 DIAGNOSIS — R112 Nausea with vomiting, unspecified: Secondary | ICD-10-CM | POA: Insufficient documentation

## 2018-12-24 DIAGNOSIS — R0789 Other chest pain: Secondary | ICD-10-CM | POA: Insufficient documentation

## 2018-12-24 HISTORY — DX: Endometriosis, unspecified: N80.9

## 2018-12-24 LAB — URINALYSIS, ROUTINE W REFLEX MICROSCOPIC
Bacteria, UA: NONE SEEN
Bilirubin Urine: NEGATIVE
Glucose, UA: NEGATIVE mg/dL
Hgb urine dipstick: NEGATIVE
Ketones, ur: 80 mg/dL — AB
Leukocytes, UA: NEGATIVE
Nitrite: NEGATIVE
Protein, ur: 100 mg/dL — AB
Specific Gravity, Urine: 1.033 — ABNORMAL HIGH (ref 1.005–1.030)
pH: 7 (ref 5.0–8.0)

## 2018-12-24 LAB — CBC
HCT: 33.7 % — ABNORMAL LOW (ref 36.0–46.0)
Hemoglobin: 10.8 g/dL — ABNORMAL LOW (ref 12.0–15.0)
MCH: 27.1 pg (ref 26.0–34.0)
MCHC: 32 g/dL (ref 30.0–36.0)
MCV: 84.5 fL (ref 80.0–100.0)
Platelets: 365 10*3/uL (ref 150–400)
RBC: 3.99 MIL/uL (ref 3.87–5.11)
RDW: 12.8 % (ref 11.5–15.5)
WBC: 9.5 10*3/uL (ref 4.0–10.5)
nRBC: 0 % (ref 0.0–0.2)

## 2018-12-24 LAB — COMPREHENSIVE METABOLIC PANEL
ALT: 16 U/L (ref 0–44)
AST: 18 U/L (ref 15–41)
Albumin: 4.1 g/dL (ref 3.5–5.0)
Alkaline Phosphatase: 40 U/L (ref 38–126)
Anion gap: 10 (ref 5–15)
BUN: 12 mg/dL (ref 6–20)
CO2: 25 mmol/L (ref 22–32)
Calcium: 9.4 mg/dL (ref 8.9–10.3)
Chloride: 104 mmol/L (ref 98–111)
Creatinine, Ser: 0.81 mg/dL (ref 0.44–1.00)
GFR calc Af Amer: >60 mL/min (ref >60)
GFR calc non Af Amer: >60 mL/min (ref >60)
Glucose, Bld: 132 mg/dL — ABNORMAL HIGH (ref 70–99)
Potassium: 3.9 mmol/L (ref 3.5–5.1)
Sodium: 139 mmol/L (ref 135–145)
Total Bilirubin: 0.4 mg/dL (ref 0.3–1.2)
Total Protein: 7.9 g/dL (ref 6.5–8.1)

## 2018-12-24 LAB — I-STAT BETA HCG BLOOD, ED (MC, WL, AP ONLY): I-stat hCG, quantitative: <5 m[IU]/mL (ref <5)

## 2018-12-24 LAB — I-STAT TROPONIN, ED: Troponin i, poc: 0 ng/mL (ref 0.00–0.08)

## 2018-12-24 LAB — LIPASE, BLOOD: Lipase: 19 U/L (ref 11–51)

## 2018-12-24 MED ORDER — HYDROMORPHONE HCL 1 MG/ML IJ SOLN
0.5000 mg | Freq: Once | INTRAMUSCULAR | Status: AC
  Administered 2018-12-24: 0.5 mg via INTRAVENOUS
  Filled 2018-12-24: qty 1

## 2018-12-24 MED ORDER — ONDANSETRON HCL 4 MG/2ML IJ SOLN
4.0000 mg | Freq: Once | INTRAMUSCULAR | Status: AC
  Administered 2018-12-24: 4 mg via INTRAVENOUS
  Filled 2018-12-24: qty 2

## 2018-12-24 MED ORDER — LACTATED RINGERS IV BOLUS
1000.0000 mL | Freq: Once | INTRAVENOUS | Status: AC
  Administered 2018-12-24: 1000 mL via INTRAVENOUS

## 2018-12-24 MED ORDER — FAMOTIDINE IN NACL 20-0.9 MG/50ML-% IV SOLN
20.0000 mg | Freq: Once | INTRAVENOUS | Status: AC
  Administered 2018-12-24: 20 mg via INTRAVENOUS
  Filled 2018-12-24: qty 50

## 2018-12-24 NOTE — ED Triage Notes (Signed)
Pt reports generalized abd pain, chest pain, heartburn, nausea, vomiting, and SOB that started today.  Pt relates symptoms to endometriosis.

## 2018-12-25 MED ORDER — KETOROLAC TROMETHAMINE 15 MG/ML IJ SOLN
15.0000 mg | Freq: Once | INTRAMUSCULAR | Status: AC
  Administered 2018-12-25: 15 mg via INTRAVENOUS
  Filled 2018-12-25: qty 1

## 2018-12-25 MED ORDER — ONDANSETRON HCL 4 MG PO TABS: 4.0000 mg | ORAL_TABLET | Freq: Four times a day (QID) | ORAL | 0 refills | Status: AC

## 2018-12-25 NOTE — ED Provider Notes (Signed)
[facility]   CSN: [id number] Arrival date & time: 12/24/18  2129     History   Chief Complaint Chief Complaint  Patient presents with  . Abdominal Pain  . Chest Pain    HPI Truitt Merleveri Weber is a 22 y.o. female.  HPI   22yf with multiple complaints. Began today. Diffuse abdominal pain. Crampy and sometimes sharp. No appreciable exacerbating or relieving factors. Nausea. Vomited x1. No diarrhea. Chest pain. Feels like having heart burn. SOB. Coughing. Lightheaded.   Past Medical History:  Diagnosis Date  . Anxiety   . Endometriosis     Patient Active Problem List   Diagnosis Date Noted  . Depression 02/08/2018  . Suicidal ideation 02/08/2018    History reviewed. No pertinent surgical history.   OB History   No obstetric history on file.      Home Medications    Prior to Admission medications   Medication Sig Start Date End Date Taking? Authorizing Provider  acetaminophen (TYLENOL) 500 MG tablet Take 500 mg by mouth every 6 (six) hours as needed for mild pain.    [provider]  naproxen sodium (ALEVE) 220 MG tablet Take 220 mg by mouth daily as needed (chest pain).     [provider]    Family History No family history on file.  Social History Social History   Tobacco Use  . Smoking status: Never Smoker  . Smokeless tobacco: Never Used  Substance Use Topics  . Alcohol use: No    Frequency: Never    Comment: Pt denied  . Drug use: No    Comment: Pt denied     Allergies   Cashew nut oil and Watermelon [citrullus vulgaris]   Review of Systems Review of Systems  All systems reviewed and negative, other than as noted in HPI.  Physical Exam Updated Vital Signs BP 102/63   Pulse (!) 58   Temp 99.2 F (37.3 C) (Oral)   Resp 14   Ht 5\' 5"  (1.651 m)   Wt 89.4 kg   LMP 12/24/2018   SpO2 100%   BMI 32.78 kg/m   Physical Exam Vitals signs and nursing note reviewed.    Constitutional:      General: She is not in acute distress.    Appearance: She is well-developed.  HENT:     Head: Normocephalic and atraumatic.  Eyes:     General:        Right eye: No discharge.        Left eye: No discharge.     Conjunctiva/sclera: Conjunctivae normal.  Neck:     Musculoskeletal: Neck supple.  Cardiovascular:     Rate and Rhythm: Normal rate and regular rhythm.     Heart sounds: Normal heart sounds. No murmur. No friction rub. No gallop.   Pulmonary:     Effort: Pulmonary effort is normal. No respiratory distress.     Breath sounds: Normal breath sounds.  Abdominal:     General: There is no distension.     Palpations: Abdomen is soft.     Tenderness: There is no abdominal tenderness.  Musculoskeletal:        General: No tenderness.  Skin:    General: Skin is warm and dry.  Neurological:     Mental Status: She is alert.  Psychiatric:        Behavior: Behavior normal.        Thought Content: Thought content normal.  ED Treatments / Results  Labs (all labs ordered are listed, but only abnormal results are displayed) Labs Reviewed  COMPREHENSIVE METABOLIC PANEL - Abnormal; Notable for the following components:      Result Value   Glucose, Bld 132 (*)    All other components within normal limits  CBC - Abnormal; Notable for the following components:   Hemoglobin 10.8 (*)    HCT 33.7 (*)    All other components within normal limits  URINALYSIS, ROUTINE W REFLEX MICROSCOPIC - Abnormal; Notable for the following components:   APPearance HAZY (*)    Specific Gravity, Urine 1.033 (*)    Ketones, ur 80 (*)    Protein, ur 100 (*)    All other components within normal limits  LIPASE, BLOOD  I-STAT BETA HCG BLOOD, ED ([facility], [facility], [facility] ONLY)  I-STAT TROPONIN, ED    EKG EKG Interpretation  Date/Time:  Sunday December 24 2018 21:34:00 EST Ventricular Rate:  98 PR Interval:  136 QRS Duration: 82 QT Interval:  320 QTC Calculation: 408 R  Axis:   99 Text Interpretation:  Normal sinus rhythm Rightward axis T wave abnormality, consider inferior ischemia T wave abnormality, consider anterolateral ischemia Abnormal ECG Confirmed by Tamekia Rotter ([id number]) on 12/24/2018 9:53:44 PM   Radiology No results found.  Procedures Procedures (including critical care time)  Medications Ordered in ED Medications  lactated ringers bolus 1,000 mL (1,000 mLs Intravenous New Bag/Given 12/24/18 2338)  famotidine (PEPCID) IVPB 20 mg premix (20 mg Intravenous New Bag/Given 12/24/18 2337)  HYDROmorphone (DILAUDID) injection 0.5 mg (0.5 mg Intravenous Given 12/24/18 2337)  ondansetron (ZOFRAN) injection 4 mg (4 mg Intravenous Given 12/24/18 2337)     Initial Impression / Assessment and Plan / ED Course  I have reviewed the triage vital signs and the nursing notes.  Pertinent labs & imaging results that were available during my care of the patient were reviewed by me and considered in my medical decision making (see chart for details).     22  year old female with multiple complaints.  Primarily nausea and vomiting.  Abdominal exam is benign.  She is afebrile.  Hemodynamically stable.  Numerous other complaints occluding burning in her chest, shortness of breath, body aches, subjective fever.  I suspect this may simply be a viral GI illness.  ED work-up fairly unremarkable aside from ketonuria and high specific gravity.  She was given IV fluids and her symptoms were treated. Feeling better.   It has been determined that no acute conditions requiring further emergency intervention are present at this time. The patient has been advised of the diagnosis and plan. I reviewed any labs and imaging including any potential incidental findings. We have discussed signs and symptoms that warrant return to the ED and they are listed in the discharge instructions.    Final Clinical Impressions(s) / ED Diagnoses   Final diagnoses:  Nausea and vomiting,  intractability of vomiting not specified, unspecified vomiting type  Abdominal pain, unspecified abdominal location    ED Discharge Orders    None       Raeford RazorKohut, Chosen Geske, MD 01/04/19 2300

## 2019-02-10 IMAGING — CR DG CHEST 2V
2 series · 2 of 2 positions shown · non-contrast
Comparison: None.

CLINICAL DATA: Chest pain worsening over the past 2 months.

EXAM:
CHEST - 2 VIEW

[w chest pa]
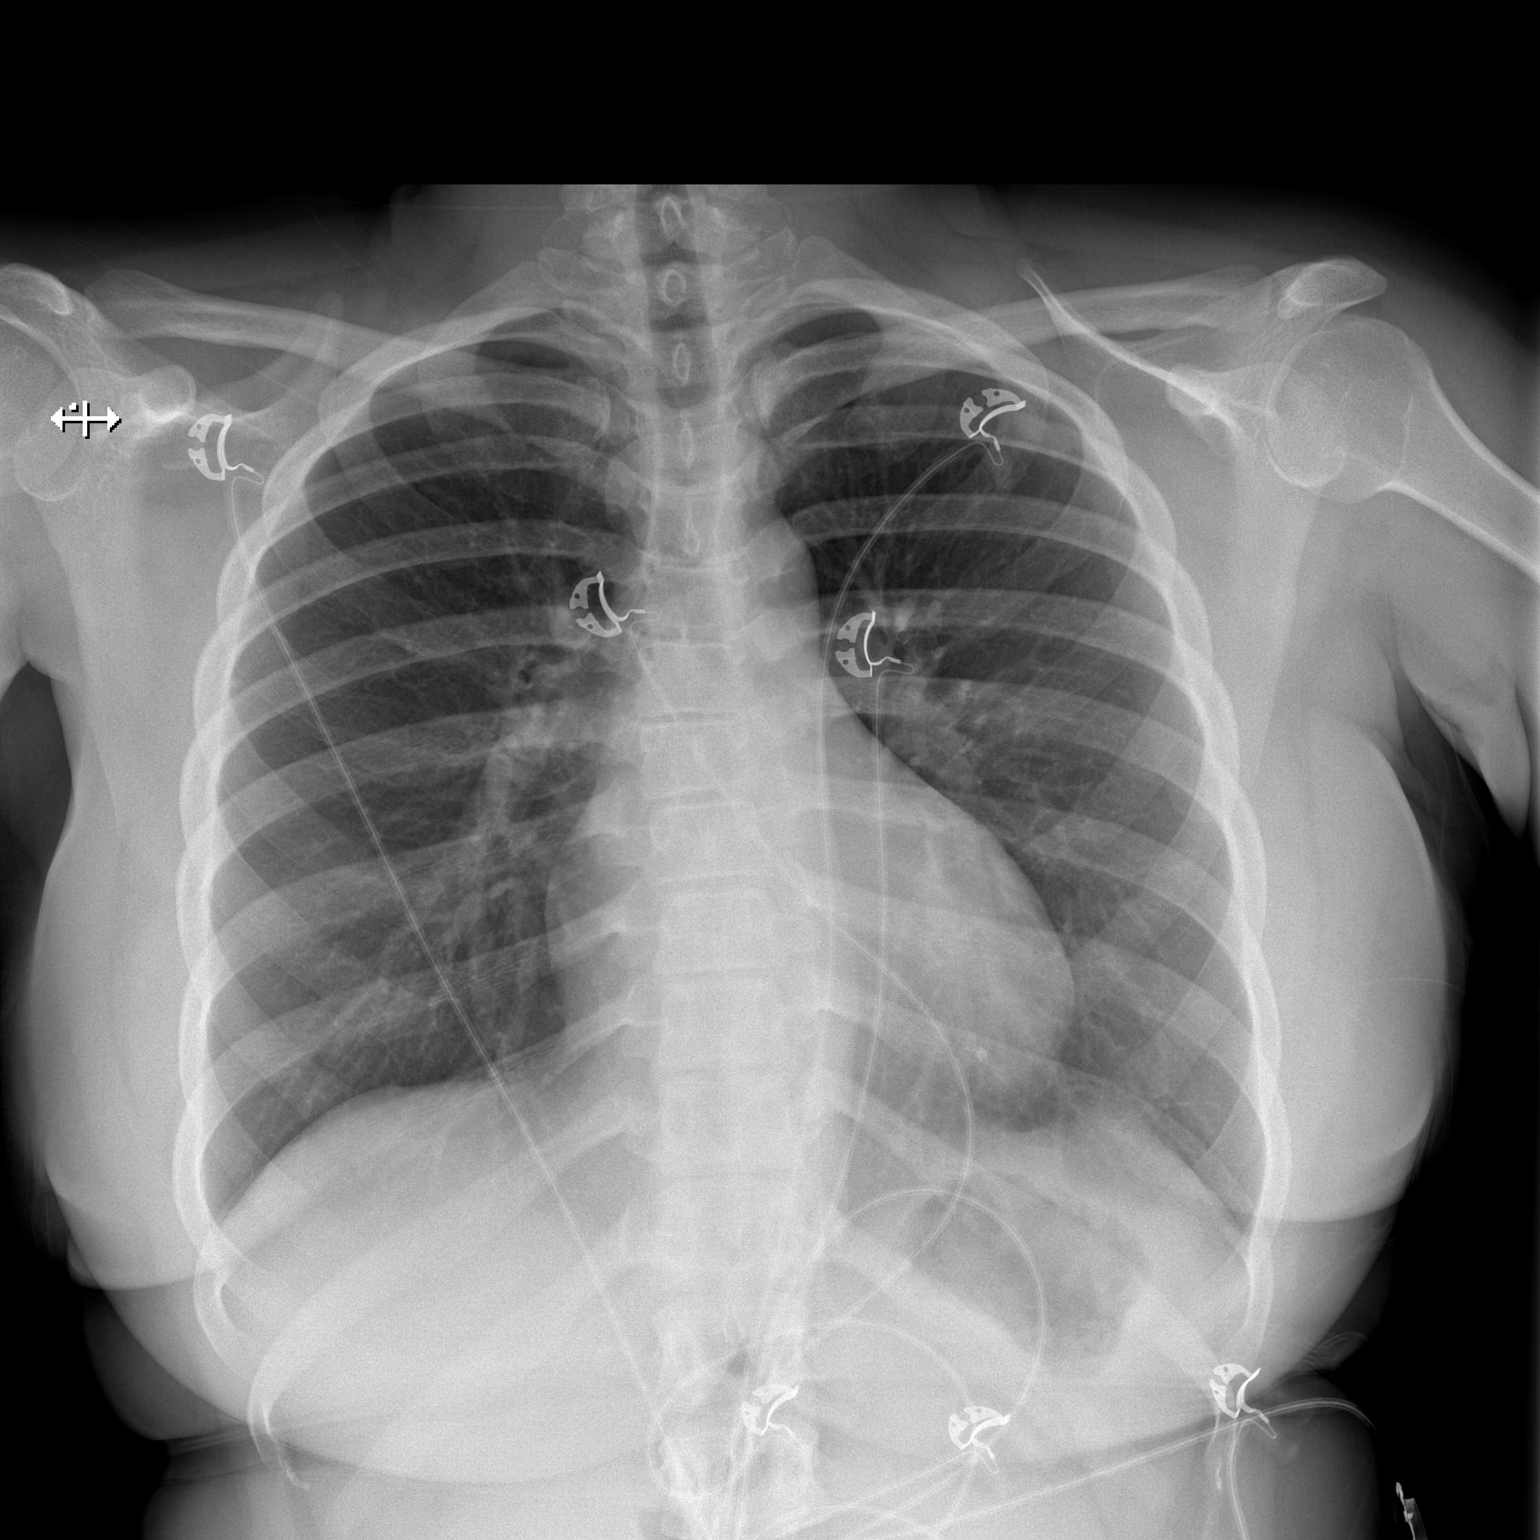

[w chest lat]
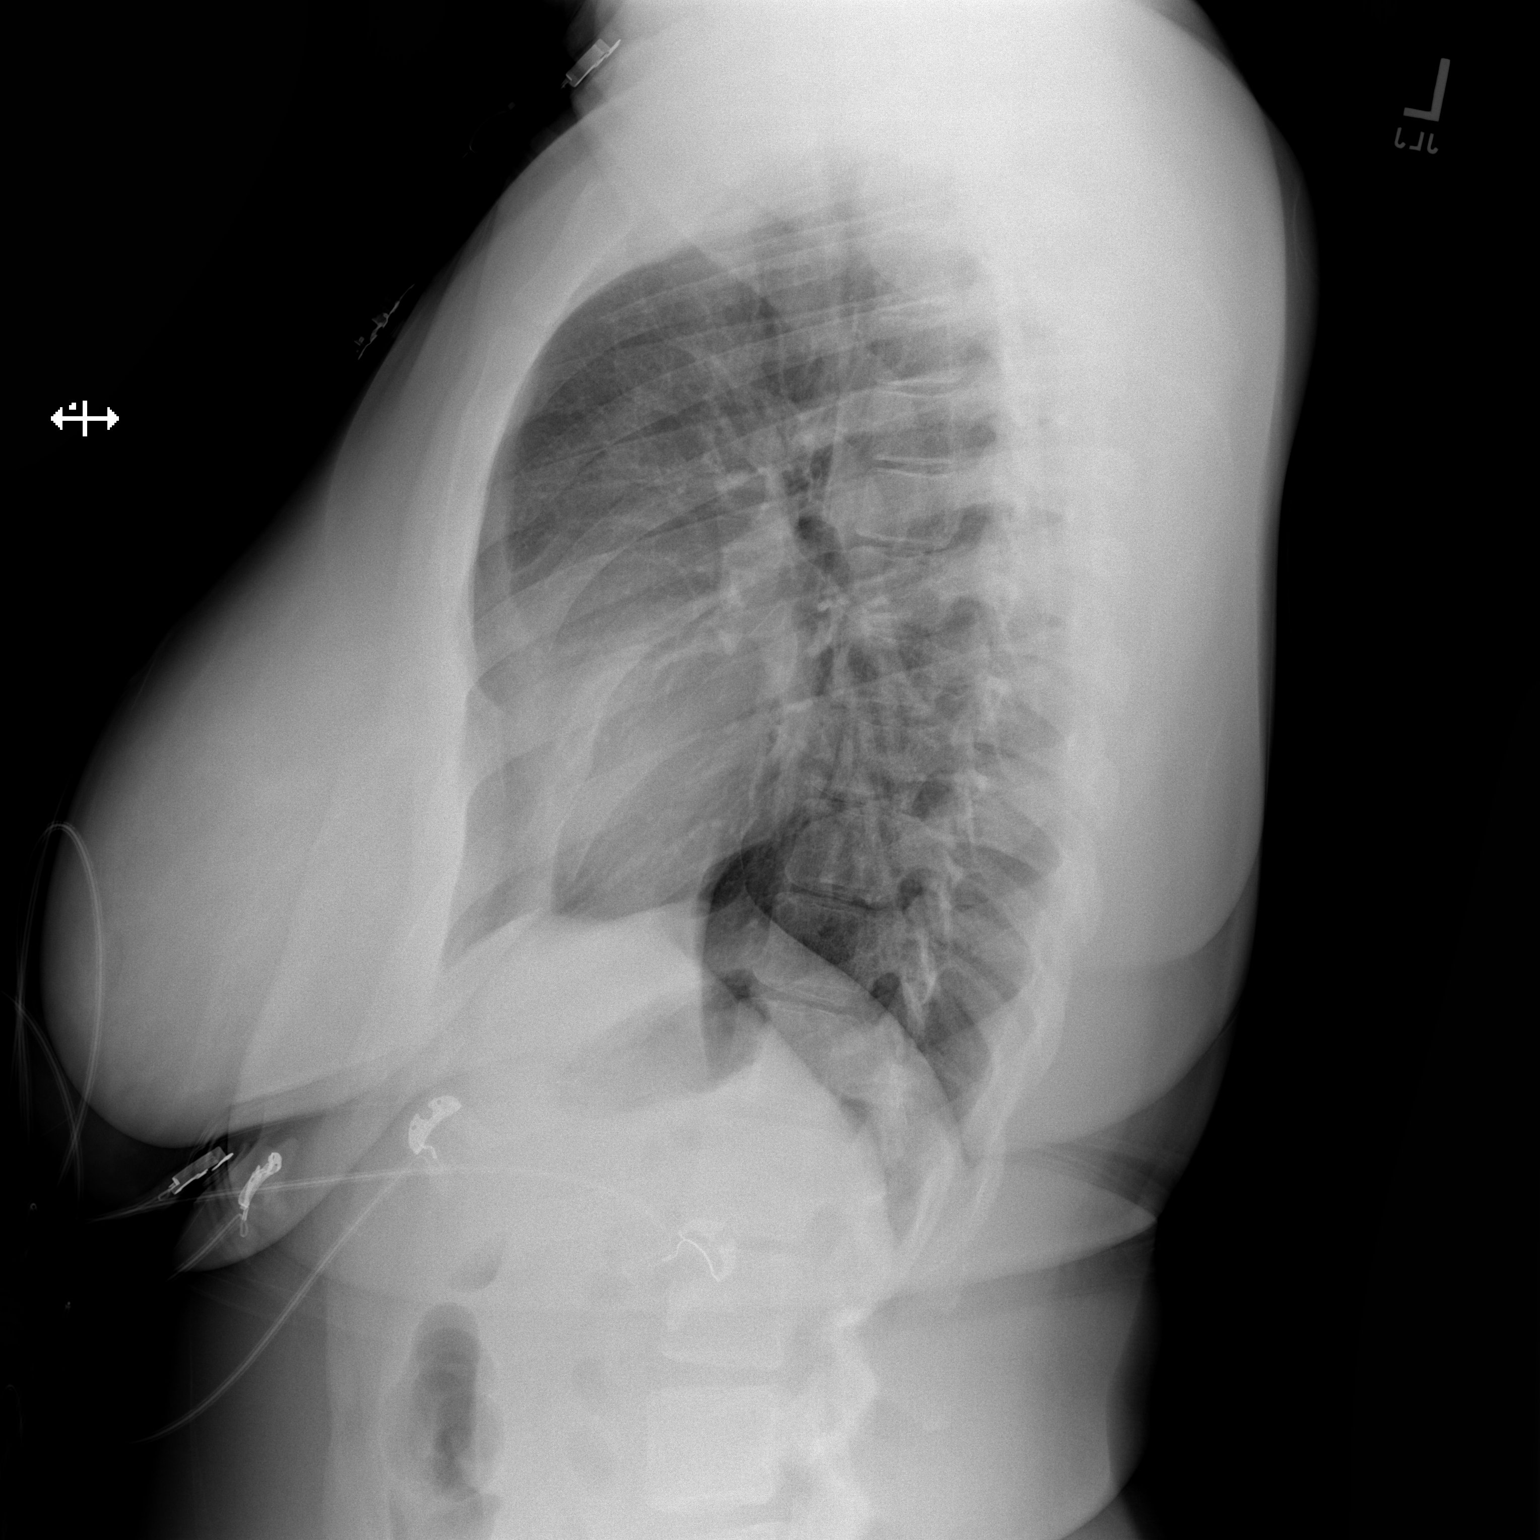

[2 of 2 positions shown; findings below may reference images not displayed]

FINDINGS: The heart size and mediastinal contours are within normal limits.
Both lungs are clear. The visualized skeletal structures are
unremarkable.
IMPRESSION: No active cardiopulmonary disease.

## 2020-01-14 ENCOUNTER — Other Ambulatory Visit: Payer: Self-pay

## 2020-01-14 ENCOUNTER — Emergency Department (HOSPITAL_COMMUNITY)
Admission: EM | Admit: 2020-01-14 | Discharge: 2020-01-14 | Disposition: A | Discharge status: Home / Self Care | Payer: BLUE CROSS/BLUE SHIELD | Attending: Emergency Medicine | Admitting: Emergency Medicine

## 2020-01-14 DIAGNOSIS — Z20822 Contact with and (suspected) exposure to covid-19: Secondary | ICD-10-CM | POA: Insufficient documentation

## 2020-01-14 DIAGNOSIS — K0889 Other specified disorders of teeth and supporting structures: Secondary | ICD-10-CM | POA: Diagnosis present

## 2020-01-14 DIAGNOSIS — R1110 Vomiting, unspecified: Secondary | ICD-10-CM | POA: Diagnosis not present

## 2020-01-14 DIAGNOSIS — R05 Cough: Secondary | ICD-10-CM | POA: Diagnosis not present

## 2020-01-14 DIAGNOSIS — R0981 Nasal congestion: Secondary | ICD-10-CM | POA: Insufficient documentation

## 2020-01-14 DIAGNOSIS — K0381 Cracked tooth: Secondary | ICD-10-CM | POA: Diagnosis not present

## 2020-01-14 DIAGNOSIS — R509 Fever, unspecified: Secondary | ICD-10-CM | POA: Diagnosis not present

## 2020-01-14 LAB — SARS CORONAVIRUS 2 (TAT 6-24 HRS): SARS Coronavirus 2: NEGATIVE

## 2020-01-14 MED ORDER — AMOXICILLIN-POT CLAVULANATE 875-125 MG PO TABS: 1.0000 | ORAL_TABLET | Freq: Two times a day (BID) | ORAL | 0 refills | Status: AC

## 2020-01-14 MED ORDER — ACETAMINOPHEN 500 MG PO TABS
1000.0000 mg | ORAL_TABLET | Freq: Once | ORAL | Status: AC
  Administered 2020-01-14: 08:00:00 1000 mg via ORAL
  Filled 2020-01-14: qty 2

## 2020-01-14 MED ORDER — ONDANSETRON 4 MG PO TBDP: ORAL_TABLET | ORAL | 0 refills | Status: AC

## 2020-01-14 NOTE — Discharge Instructions (Addendum)
Use Tylenol and Motrin as needed every 6 hours for pain and fever. Stay well-hydrated with water.  Follow-up with a dentist as soon as possible.  Follow-up your Covid test they should call you if it is positive in approximately 24 hours.  Isolate as directed. Use Zofran as needed for nausea vomiting. Take antibiotics as directed.

## 2020-01-14 NOTE — ED Notes (Signed)
Labs drawn and sent to lab no orders

## 2020-01-14 NOTE — ED Triage Notes (Signed)
The pt is c/o a toothache since last pm  She reports that  The toothache caused her chest to hurt  And when she vomited it burned her throat  lmp jan4th

## 2020-01-14 NOTE — ED Notes (Signed)
Patient awake alert, color pink,chest clear,good aeration,no retractions 3 plus pulses,2sec refill,patient with discharge after avs reviewed

## 2020-01-14 NOTE — ED Notes (Signed)
Patient awaked alert,color pink,chest clear,good aeration,no retractions 3plus pulses,<2sec refill,patient with self,tolerated covid and po medicine

## 2020-01-14 NOTE — ED Provider Notes (Signed)
[facility] Provider Note   CSN: [id number] Arrival date & time: 01/14/20  0539     History Chief Complaint  Patient presents with  . Dental Pain  . Chest Pain    Anne Weber is a 24 y.o. female.  Patient with anxiety history presents with dental pain, vomiting and chest discomfort after vomiting.  Worsening since yesterday.  Patient's had known dental problems in the right lower tooth and no current dentist.  Patient denies documented fevers.  No abdominal discomfort.  Patient did have Covid contact and has not been tested.  Patient's had minimal cough.  Dental pain constant.  Chest discomfort after vomiting burning sensation.  Patient denies cardiac history or blood clot risk factors.        Past Medical History:  Diagnosis Date  . Anxiety   . Endometriosis     Patient Active Problem List   Diagnosis Date Noted  . Depression 02/08/2018  . Suicidal ideation 02/08/2018    No past surgical history on file.   OB History   No obstetric history on file.     No family history on file.  Social History   Tobacco Use  . Smoking status: Never Smoker  . Smokeless tobacco: Never Used  Substance Use Topics  . Alcohol use: No    Comment: Pt denied  . Drug use: No    Comment: Pt denied    Home Medications Prior to Admission medications   Medication Sig Start Date End Date Taking? Authorizing Provider  acetaminophen (TYLENOL) 500 MG tablet Take 500 mg by mouth every 6 (six) hours as needed for mild pain.    [provider]  amoxicillin-clavulanate (AUGMENTIN) 875-125 MG tablet Take 1 tablet by mouth 2 (two) times daily. One po bid x 7 days 01/14/20   Elnora Morrison, MD  naproxen sodium (ALEVE) 220 MG tablet Take 220 mg by mouth daily as needed (chest pain).     [provider]  ondansetron (ZOFRAN) 4 MG tablet Take 1 tablet (4 mg total) by mouth every 6 (six) hours. 12/25/18   Virgel Manifold, MD    Allergies      Cashew nut oil and Watermelon [citrullus vulgaris]  Review of Systems   Review of Systems  Constitutional: Negative for chills and fever.  HENT: Positive for congestion and dental problem.   Eyes: Negative for visual disturbance.  Respiratory: Positive for cough. Negative for shortness of breath.   Cardiovascular: Negative for chest pain.  Gastrointestinal: Positive for vomiting. Negative for abdominal pain.  Genitourinary: Negative for dysuria and flank pain.  Musculoskeletal: Negative for back pain, neck pain and neck stiffness.  Skin: Negative for rash.  Neurological: Negative for light-headedness and headaches.    Physical Exam Updated Vital Signs BP 115/87 (BP Location: Right Arm)   Pulse 98   Temp 100.3 F (37.9 C) (Oral)   Resp 18   Ht 5\' 5"  (1.651 m)   Wt 88.5 kg   LMP 12/31/2019   SpO2 100%   BMI 32.45 kg/m   Physical Exam Vitals and nursing note reviewed.  Constitutional:      Appearance: She is well-developed.  HENT:     Head: Normocephalic and atraumatic.     Comments: Patient has inflamed gingiva right lower anterior, fractured tooth no pulp visualized, no external abscess palpated, no trismus.  No submandibular swelling.  Neck supple. Eyes:     General:        Right eye: No  discharge.        Left eye: No discharge.     Conjunctiva/sclera: Conjunctivae normal.  Neck:     Trachea: No tracheal deviation.  Cardiovascular:     Rate and Rhythm: Regular rhythm. Tachycardia present.     Heart sounds: Normal heart sounds.  Pulmonary:     Effort: Pulmonary effort is normal.     Breath sounds: Normal breath sounds.  Abdominal:     General: There is no distension.     Palpations: Abdomen is soft.     Tenderness: There is no abdominal tenderness. There is no guarding.  Musculoskeletal:     Cervical back: Normal range of motion and neck supple.  Lymphadenopathy:     Cervical: No cervical adenopathy.  Skin:    General: Skin is warm.     Findings: No rash.   Neurological:     Mental Status: She is alert and oriented to person, place, and time.     ED Results / Procedures / Treatments   Labs (all labs ordered are listed, but only abnormal results are displayed) Labs Reviewed  SARS CORONAVIRUS 2 (TAT 6-24 HRS)    EKG EKG Interpretation  Date/Time:  Monday January 14 2020 05:46:40 EST Ventricular Rate:  106 PR Interval:  154 QRS Duration: 84 QT Interval:  306 QTC Calculation: 406 R Axis:   104 Text Interpretation: Sinus tachycardia No significant changes from prior ecg Dec 2019 Confirmed by Alvester Chou [id number]) on 01/14/2020 7:13:46 AM   Radiology No results found.  Procedures Procedures (including critical care time)  Medications Ordered in ED Medications  acetaminophen (TYLENOL) tablet 1,000 mg (has no administration in time range)    ED Course  I have reviewed the triage vital signs and the nursing notes.  Pertinent labs & imaging results that were available during my care of the patient were reviewed by me and considered in my medical decision making (see chart for details).    MDM Rules/Calculators/A&P                     Patient presents with clinical concern for apical dental abscess/dental infection.  Discussed importance of follow-up with dentist/oral surgery this week resource guide given.  Plan for oral antibiotics.  Patient has low-grade temperature mild tachycardia likely secondarily to that in addition to pain.  Tylenol ordered.  EKG reviewed mild tachycardia.  No active vomiting in the ER.  Zofran for home.  Strict reasons to return given. Outpatient covid test.  Anne Weber was evaluated in Emergency Department on 01/14/2020 for the symptoms described in the history of present illness. She was evaluated in the context of the global COVID-19 pandemic, which necessitated consideration that the patient might be at risk for infection with the SARS-CoV-2 virus that causes COVID-19. Institutional protocols and  algorithms that pertain to the evaluation of patients at risk for COVID-19 are in a state of rapid change based on information released by regulatory bodies including the CDC and federal and state organizations. These policies and algorithms were followed during the patient's care in the ED.  Results and differential diagnosis were discussed with the patient/parent/guardian. Xrays were independently reviewed by myself.  Close follow up outpatient was discussed, comfortable with the plan.   Medications  acetaminophen (TYLENOL) tablet 1,000 mg (has no administration in time range)    Vitals:   01/14/20 0550 01/14/20 0621  BP:  115/87  Pulse:  98  Resp:  18  Temp:  100.3 F (  37.9 C)  TempSrc:  Oral  SpO2:  100%  Weight: 88.5 kg   Height: 5\' 5"  (1.651 m)     Final diagnoses:  Pain, dental  Vomiting in adult    Final Clinical Impression(s) / ED Diagnoses Final diagnoses:  Pain, dental  Vomiting in adult    Rx / DC Orders ED Discharge Orders         Ordered    amoxicillin-clavulanate (AUGMENTIN) 875-125 MG tablet  2 times daily     01/14/20 01/16/20           [id number], MD 01/14/20 [phone]
